# Patient Record
Sex: Male | Born: 2000 | Race: White | Hispanic: No | Marital: Single | Smoking: Never smoker
Health system: Southern US, Community
[De-identification: ages and names within clinical notes are randomized; demographics above are authoritative.]

## PROBLEM LIST (undated history)

## (undated) HISTORY — PX: TONSILLECTOMY: SUR1361

## (undated) HISTORY — PX: ADENOIDECTOMY: SUR15

---
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
## 2023-05-14 ENCOUNTER — Emergency Department

## 2023-05-14 ENCOUNTER — Encounter: Payer: Self-pay | Admitting: Emergency Medicine

## 2023-05-14 ENCOUNTER — Other Ambulatory Visit: Payer: Self-pay

## 2023-05-14 DIAGNOSIS — W208XXA Other cause of strike by thrown, projected or falling object, initial encounter: Secondary | ICD-10-CM | POA: Insufficient documentation

## 2023-05-14 DIAGNOSIS — Y990 Civilian activity done for income or pay: Secondary | ICD-10-CM | POA: Diagnosis not present

## 2023-05-14 DIAGNOSIS — S5012XA Contusion of left forearm, initial encounter: Secondary | ICD-10-CM | POA: Diagnosis not present

## 2023-05-14 DIAGNOSIS — S4992XA Unspecified injury of left shoulder and upper arm, initial encounter: Secondary | ICD-10-CM | POA: Diagnosis present

## 2023-05-14 NOTE — ED Triage Notes (Signed)
 Pt in from [employer] job, states he got his L arm crushed between rubber and conveyer belt. Pt states he was trying to place the "batch" of rubber material onto the belt as it had fallen off. Pt arrives with swelling and abrasion to L forearm, states machine's safety turned on before he was pulled further into machine. No degloving present, has full ROM and PMS intact.

## 2023-05-15 ENCOUNTER — Emergency Department
Admission: EM | Admit: 2023-05-15 | Discharge: 2023-05-15 | Disposition: A | Discharge status: Home / Self Care | Attending: Emergency Medicine | Admitting: Emergency Medicine

## 2023-05-15 DIAGNOSIS — S40022A Contusion of left upper arm, initial encounter: Secondary | ICD-10-CM

## 2023-05-15 NOTE — ED Notes (Signed)
 WC specimen taking to the lab by Lovett Ruck, NT and Shelagh Derrick, [place], signed lab form and put specimen in the fridge

## 2023-05-15 NOTE — ED Notes (Signed)
 Pt is noted to have swelling to the upper forearm by the elbow with a small abrasion. No discoloration noted at this time. Pms intact.

## 2023-05-15 NOTE — ED Provider Notes (Signed)
 [facility] Provider Note    Event Date/Time   First MD Initiated Contact with Patient 05/15/23 0134     (approximate)   History   Arm Injury and Injury at Work   HPI  Earl Allison is a 23 y.o. male   Past medical history of no significant past medical history presents to the Emergency Department with an injury to his left arm while at work.  A piece of rubber dropped and fell onto his left arm which pinned it against that and a conveyor belt machine.  No other injuries noted.  Independent Historian contributed to assessment above: His supervisor at bedside to corroborate information as above    Physical Exam   Triage Vital Signs: ED Triage Vitals  Encounter Vitals Group     BP 05/14/23 2326 128/74     Systolic BP Percentile --      Diastolic BP Percentile --      Pulse Rate 05/14/23 2326 87     Resp 05/14/23 2326 16     Temp 05/14/23 2326 98.4 F (36.9 C)     Temp Source 05/14/23 2326 Oral     SpO2 05/14/23 2326 96 %     Weight 05/14/23 2327 175 lb 3.2 oz (79.5 kg)     Height 05/14/23 2327 6' (1.829 m)     Head Circumference --      Peak Flow --      Pain Score 05/15/23 0157 4     Pain Loc --      Pain Education --      Exclude from Growth Chart --     Most recent vital signs: Vitals:   05/14/23 2326  BP: 128/74  Pulse: 87  Resp: 16  Temp: 98.4 F (36.9 C)  SpO2: 96%    General: Awake, no distress.  CV:  Good peripheral perfusion.  Resp:  Normal effort.  Abd:  No distention.  Other:  He has a contusion to his left forearm.  No pain out of proportion or crepitus to that area.  Soft compartment.  No bony tenderness to the elbow or forearm.  Neurovascular intact.  Able to range at the shoulder elbow and wrist.   ED Results / Procedures / Treatments   Labs (all labs ordered are listed, but only abnormal results are displayed) Labs Reviewed - No data to display    RADIOLOGY I independently reviewed and interpreted x-ray of  the elbow and see no obvious fracture or dislocation I also reviewed radiologist's formal read.   PROCEDURES:  Critical Care performed: No  Procedures   MEDICATIONS ORDERED IN ED: Medications - No data to display  IMPRESSION / MDM / ASSESSMENT AND PLAN / ED COURSE  I reviewed the triage vital signs and the nursing notes.                                Patient's presentation is most consistent with acute presentation with potential threat to life or bodily function.  Differential diagnosis includes, but is not limited to, contusion, fracture, dislocation, compartment syndrome, neurovascular damage   MDM: Injury to the left forearm with no evidence of compartment syndrome, neurovascular intact, and with negative x-ray imaging for fractures or dislocation.  Anticipatory guidance given and he will be discharged.         FINAL CLINICAL IMPRESSION(S) / ED DIAGNOSES   Final diagnoses:  Arm contusion, left,  initial encounter     Rx / DC Orders   ED Discharge Orders     None        Note:  This document was prepared using Dragon voice recognition software and may include unintentional dictation errors.    Buell Carmin, MD 05/15/23 [phone]

## 2023-05-15 NOTE — Discharge Instructions (Signed)
 Take acetaminophen 650 mg and ibuprofen 400 mg every 6 hours for pain.  Take with food.  Thank you for choosing [place] for your health care today!  Please see your primary doctor this week for a follow up appointment.   If you have any new, worsening, or unexpected symptoms call your doctor right away or come back to the emergency department for reevaluation.  It was my pleasure to care for you today.   Daneil Dan Modesto Charon, MD

## 2023-08-10 ENCOUNTER — Other Ambulatory Visit: Payer: Self-pay

## 2023-08-10 ENCOUNTER — Emergency Department: Payer: Worker's Compensation

## 2023-08-10 ENCOUNTER — Emergency Department
Admission: EM | Admit: 2023-08-10 | Discharge: 2023-08-10 | Disposition: A | Discharge status: Home / Self Care | Payer: Worker's Compensation | Attending: Emergency Medicine | Admitting: Emergency Medicine

## 2023-08-10 DIAGNOSIS — S46912A Strain of unspecified muscle, fascia and tendon at shoulder and upper arm level, left arm, initial encounter: Secondary | ICD-10-CM | POA: Diagnosis not present

## 2023-08-10 DIAGNOSIS — T148XXA Other injury of unspecified body region, initial encounter: Secondary | ICD-10-CM

## 2023-08-10 DIAGNOSIS — X501XXA Overexertion from prolonged static or awkward postures, initial encounter: Secondary | ICD-10-CM | POA: Diagnosis not present

## 2023-08-10 DIAGNOSIS — Y990 Civilian activity done for income or pay: Secondary | ICD-10-CM | POA: Diagnosis not present

## 2023-08-10 DIAGNOSIS — S4992XA Unspecified injury of left shoulder and upper arm, initial encounter: Secondary | ICD-10-CM | POA: Diagnosis present

## 2023-08-10 DIAGNOSIS — M25512 Pain in left shoulder: Secondary | ICD-10-CM

## 2023-08-10 NOTE — ED Triage Notes (Signed)
 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

## 2023-08-10 NOTE — ED Provider Notes (Signed)
 [facility] Provider Note    Event Date/Time   First MD Initiated Contact with Patient 08/10/23 2106     (approximate)   History   Shoulder Injury   HPI  Earl Allison is a 23 y.o. male  with no pertinent past medical history presents to the emergency department with left shoulder pain.  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  His pain is worse with movement.  Patient's supervisor was concerned and brought him into the emergency department for evaluation.  Patient denies numbness, tingling, chest pain, neck pain, fever.  No other concerns at this time. No prior shoulder surgeries.  This is under [employer].     Physical Exam   Triage Vital Signs: ED Triage Vitals  Encounter Vitals Group     BP 08/10/23 2055 133/78     Girls Systolic BP Percentile --      Girls Diastolic BP Percentile --      Boys Systolic BP Percentile --      Boys Diastolic BP Percentile --      Pulse Rate 08/10/23 2055 64     Resp 08/10/23 2055 18     Temp 08/10/23 2055 98 F (36.7 C)     Temp src --      SpO2 08/10/23 2055 100 %     Weight 08/10/23 2055 160 lb (72.6 kg)     Height 08/10/23 2055 6' (1.829 m)     Head Circumference --      Peak Flow --      Pain Score 08/10/23 2054 6     Pain Loc --      Pain Education --      Exclude from Growth Chart --     Most recent vital signs: Vitals:   08/10/23 2055  BP: 133/78  Pulse: 64  Resp: 18  Temp: 98 F (36.7 C)  SpO2: 100%    General: Awake, in no acute distress. Appears stated age Neck: Supple, no nuchal rigidity. CV: Regular rate, 64 bpm. Peripheral pulses 2+ and symmetric. No edema. Respiratory: No respiratory distress. Normal respiratory effort. MSK: Normal ROM and  5/5 strength in b/l shoulders and elbows. Able to perform all shoulder motions actively.  No obvious bony deformities. No pain with palpation of the proximal  portion of the biceps tendon.  Tender to palpation in the upper trapezius region on the left. No midline cervical spinal tenderness. Skin:Warm, dry, intact. No rashes, lesions, or ecchymosis. No cyanosis or pallor. Neurological: A&Ox4 to person, place, time, and situation.   ED Results / Procedures / Treatments   Labs (all labs ordered are listed, but only abnormal results are displayed) Labs Reviewed - No data to display   EKG     RADIOLOGY X ray left shoulder ordered.    PROCEDURES:  Critical Care performed: No   Procedures   MEDICATIONS ORDERED IN ED: Medications - No data to display   IMPRESSION / MDM / ASSESSMENT AND PLAN / ED COURSE  I reviewed the triage vital signs and the nursing notes.                              Differential diagnosis includes, but is not limited to, trapezius strain, AC dislocation, clavicle fracture, rotator cuff tear  Patient's presentation is most consistent with acute complicated  illness / injury requiring diagnostic workup.  Patient is a 23 year old male who presented today following left shoulder injury that occurred at work.  X-ray of his left shoulder was ordered. I independently viewed the x-ray and radiologist's report.  I agree with the radiologist's report that there are no acute fractures or dislocations.  Patient is able to perform all active ranges of motion of the shoulder with mild tenderness to palpation of the upper trapezius on the left side.  Offered him a shoulder sling, but he denied.  He can use Tylenol  and ibuprofen at home to help with his pain as well as topical medications.  We discussed no heavy lifting and provided follow-up information with orthopedic on-call as needed if his pain is not improving over the next 4 to 6 weeks, or he can follow-up with his primary care provider.  He can return to the emergency department for any new, worsening, or concerning symptoms.  Work note provided.  Patient was given the  opportunity to ask questions; all questions were answered. Emergency department return precautions were discussed with the patient.  Patient is in agreement to the treatment plan.  Patient is stable for discharge.   FINAL CLINICAL IMPRESSION(S) / ED DIAGNOSES   Final diagnoses:  Musculoskeletal strain  Acute pain of left shoulder     Rx / DC Orders   ED Discharge Orders     None        Note:  This document was prepared using Dragon voice recognition software and may include unintentional dictation errors.     Sheron Salm, PA-C 08/10/23 2252    Dorothyann Drivers, MD 08/10/23 2315

## 2023-08-10 NOTE — Discharge Instructions (Addendum)
 We believe that your symptoms are caused by musculoskeletal strain.  Please read through the included information about additional care such as heating pads, over-the-counter pain medicine.  If you were provided a prescription please use it only as needed and as instructed.  Remember that early mobility and using the affected part of your body is actually better than keeping it immobile.  Follow-up with the doctor listed as recommended if you are not better within 4-6 weeks, or return to the emergency department with new or worsening symptoms that concern you.

## 2023-09-22 ENCOUNTER — Ambulatory Visit: Payer: Self-pay | Admitting: Family

## 2023-11-03 ENCOUNTER — Ambulatory Visit (INDEPENDENT_AMBULATORY_CARE_PROVIDER_SITE_OTHER)

## 2023-11-03 ENCOUNTER — Ambulatory Visit
Admission: EM | Admit: 2023-11-03 | Discharge: 2023-11-03 | Disposition: A | Discharge status: Home / Self Care | Payer: Self-pay | Attending: Emergency Medicine | Admitting: Emergency Medicine

## 2023-11-03 DIAGNOSIS — M5442 Lumbago with sciatica, left side: Secondary | ICD-10-CM

## 2023-11-03 DIAGNOSIS — M5441 Lumbago with sciatica, right side: Secondary | ICD-10-CM

## 2023-11-03 MED ORDER — METHOCARBAMOL 500 MG PO TABS: 500.0000 mg | ORAL_TABLET | Freq: Two times a day (BID) | ORAL | 0 refills | Status: AC | PRN

## 2023-11-03 NOTE — Discharge Instructions (Addendum)
Take ibuprofen as needed for discomfort.  Take the muscle relaxer as needed for muscle spasm; Do not drive, operate machinery, or drink alcohol with this medication as it can cause drowsiness.   Follow up with your primary care provider or an orthopedist if your symptoms are not improving.

## 2023-11-03 NOTE — ED Triage Notes (Signed)
 Patient to Urgent Care with complaints of lower back pain. States that he was under a car doing work- reached to grab something that dropped following by a sharp pain in the left side of his back. Pain radiates into his left foot.   Symptoms started this morning.   Meds: taking advil.

## 2023-11-03 NOTE — ED Provider Notes (Signed)
 Earl Allison    CSN: [id number] Arrival date & time: 11/03/23  1549      History   Chief Complaint Chief Complaint  Patient presents with   Back Pain    HPI Earl Allison is a 23 y.o. male.  Patient presents with bilateral low back pain which radiates to both legs since this morning.  He was working underneath his truck when he reached above and behind his head to catch a falling car part.  He states the pain was immediate and sharp on the left lower back but has now spread to both sides.  He took ibuprofen this morning.  He denies numbness, weakness, saddle anesthesia, loss of bowel/bladder control, bruising, redness, wounds, abdominal pain, dysuria, hematuria, fever.  Patient states he has a bad back since he was a child.  No history of of back surgery.  The history is provided by the patient and medical records.    History reviewed. No pertinent past medical history.  There are no active problems to display for this patient.   Past Surgical History:  Procedure Laterality Date   ADENOIDECTOMY     TONSILLECTOMY         Home Medications    Prior to Admission medications   Medication Sig Start Date End Date Taking? Authorizing Provider  methocarbamol  (ROBAXIN ) 500 MG tablet Take 1 tablet (500 mg total) by mouth 2 (two) times daily as needed for muscle spasms. 11/03/23  Yes Corlis Burnard DEL, NP  lidocaine  (XYLOCAINE ) 2 % solution Use as directed 15 mLs in the mouth or throat every 4 (four) hours as needed for mouth pain. Patient not taking: Reported on 11/03/2023 07/02/20   Herlinda Belton B, FNP  naproxen  (NAPROSYN ) 500 MG tablet Take 1 tablet (500 mg total) by mouth 2 (two) times daily with a meal. Patient not taking: Reported on 11/03/2023 07/02/20   Herlinda Belton NOVAK, FNP    Family History History reviewed. No pertinent family history.  Social History Social History   Tobacco Use   Smoking status: Never   Smokeless tobacco: Never  Vaping Use   Vaping  status: Every Day   Start date: 07/02/2018   Substances: Nicotine, Flavoring  Substance Use Topics   Alcohol use: Yes    Alcohol/week: 6.0 standard drinks of alcohol    Types: 2 Cans of beer, 2 Shots of liquor, 2 Standard drinks or equivalent per week    Comment: on occasions   Drug use: Yes    Types: Marijuana     Allergies   Patient has no known allergies.   Review of Systems Review of Systems  Constitutional:  Negative for chills and fever.  Gastrointestinal:  Negative for abdominal pain.  Genitourinary:  Negative for dysuria, flank pain and hematuria.  Musculoskeletal:  Positive for back pain. Negative for arthralgias, gait problem and joint swelling.  Skin:  Negative for color change, rash and wound.  Neurological:  Negative for weakness and numbness.     Physical Exam Triage Vital Signs ED Triage Vitals  Encounter Vitals Group     BP 11/03/23 1602 133/65     Girls Systolic BP Percentile --      Girls Diastolic BP Percentile --      Boys Systolic BP Percentile --      Boys Diastolic BP Percentile --      Pulse Rate 11/03/23 1602 99     Resp 11/03/23 1602 18     Temp 11/03/23 1602 97.7 F (36.5  C)     Temp src --      SpO2 11/03/23 1602 98 %     Weight --      Height --      Head Circumference --      Peak Flow --      Pain Score 11/03/23 1614 4     Pain Loc --      Pain Education --      Exclude from Growth Chart --    No data found.  Updated Vital Signs BP 133/65   Pulse 99   Temp 97.7 F (36.5 C)   Resp 18   SpO2 98%   Visual Acuity Right Eye Distance:   Left Eye Distance:   Bilateral Distance:    Right Eye Near:   Left Eye Near:    Bilateral Near:     Physical Exam Constitutional:      General: He is not in acute distress. HENT:     Mouth/Throat:     Mouth: Mucous membranes are moist.  Cardiovascular:     Rate and Rhythm: Normal rate.  Pulmonary:     Effort: Pulmonary effort is normal. No respiratory distress.  Abdominal:      General: Bowel sounds are normal.     Palpations: Abdomen is soft.     Tenderness: There is no abdominal tenderness. There is no right CVA tenderness, left CVA tenderness, guarding or rebound.  Musculoskeletal:        General: Tenderness present. No swelling or deformity. Normal range of motion.     Comments: Muscular tenderness of lower back on both sides.  Spine nontender.  Skin:    Capillary Refill: Capillary refill takes less than 2 seconds.     Findings: No bruising, erythema, lesion or rash.  Neurological:     General: No focal deficit present.     Mental Status: He is alert.     Sensory: No sensory deficit.     Motor: No weakness.     Gait: Gait normal.      UC Treatments / Results  Labs (all labs ordered are listed, but only abnormal results are displayed) Labs Reviewed - No data to display  EKG   Radiology DG Lumbar Spine Complete Result Date: 11/03/2023 CLINICAL DATA:  Twisting injury this morning, lower back pain EXAM: LUMBAR SPINE - COMPLETE 4+ VIEW COMPARISON:  None Available. FINDINGS: Frontal, bilateral oblique, lateral views of the lumbar spine are obtained on 5 images. Five non-rib-bearing lumbar type vertebral bodies are in normal anatomic alignment. No acute displaced fracture. Disc spaces are well preserved. Mild facet hypertrophy at L4-5 and L5-S1. Sacroiliac joints are normal. IMPRESSION: 1. Mild lower lumbar facet hypertrophy.  No acute fracture. Electronically Signed   By: Ozell Daring M.D.   On: 11/03/2023 16:42    Procedures Procedures (including critical care time)  Medications Ordered in UC Medications - No data to display  Initial Impression / Assessment and Plan / UC Course  I have reviewed the triage vital signs and the nursing notes.  Pertinent labs & imaging results that were available during my care of the patient were reviewed by me and considered in my medical decision making (see chart for details).    Bilateral low back pain with  bilateral sciatica.  Afebrile and vital signs are stable.  Xray of lumbar spine shows 1. Mild lower lumbar facet hypertrophy.  No acute fracture.  Treating with methocarbamol ; precautions for drowsiness with this medication discussed.  Discussed Tylenol  or ibuprofen as needed.  Instructed patient to follow-up with an orthopedist.  ED precautions given.  Education provided on sciatica.  He agrees to plan of care.   Final Clinical Impressions(s) / UC Diagnoses   Final diagnoses:  Acute bilateral low back pain with bilateral sciatica     Discharge Instructions      Take ibuprofen as needed for discomfort.  Take the muscle relaxer as needed for muscle spasm; Do not drive, operate machinery, or drink alcohol with this medication as it can cause drowsiness.   Follow up with your primary care provider or an orthopedist if your symptoms are not improving.       ED Prescriptions     Medication Sig Dispense Auth. Provider   methocarbamol  (ROBAXIN ) 500 MG tablet Take 1 tablet (500 mg total) by mouth 2 (two) times daily as needed for muscle spasms. 10 tablet Corlis Burnard DEL, NP      I have reviewed the PDMP during this encounter.   Corlis Burnard DEL, NP 11/03/23 [phone]
